# Patient Record
Sex: Female | Born: 1980 | Race: Black or African American | Hispanic: No | Marital: Married | Smoking: Never smoker
Health system: Southern US, Community
[De-identification: ages and names within clinical notes are randomized; demographics above are authoritative.]

## PROBLEM LIST (undated history)

## (undated) HISTORY — PX: TUBAL LIGATION: SHX77

## (undated) HISTORY — PX: CHOLECYSTECTOMY: SHX55

---

## 1999-04-15 ENCOUNTER — Encounter: Payer: Self-pay | Admitting: Obstetrics & Gynecology

## 1999-04-15 ENCOUNTER — Inpatient Hospital Stay (HOSPITAL_COMMUNITY): Admission: AD | Admit: 1999-04-15 | Discharge: 1999-04-15 | Payer: Self-pay | Admitting: Obstetrics & Gynecology

## 2001-05-18 ENCOUNTER — Other Ambulatory Visit: Admission: RE | Admit: 2001-05-18 | Discharge: 2001-05-18 | Payer: Self-pay | Admitting: Family Medicine

## 2003-04-01 ENCOUNTER — Ambulatory Visit (HOSPITAL_COMMUNITY): Admission: AD | Admit: 2003-04-01 | Discharge: 2003-04-02 | Payer: Self-pay | Admitting: Obstetrics and Gynecology

## 2003-04-03 ENCOUNTER — Ambulatory Visit (HOSPITAL_COMMUNITY): Admission: AD | Admit: 2003-04-03 | Discharge: 2003-04-03 | Payer: Self-pay | Admitting: Obstetrics and Gynecology

## 2003-04-24 ENCOUNTER — Inpatient Hospital Stay (HOSPITAL_COMMUNITY): Admission: RE | Admit: 2003-04-24 | Discharge: 2003-04-26 | Payer: Self-pay | Admitting: Obstetrics and Gynecology

## 2003-08-07 ENCOUNTER — Emergency Department (HOSPITAL_COMMUNITY): Admission: EM | Admit: 2003-08-07 | Discharge: 2003-08-07 | Payer: Self-pay | Admitting: Emergency Medicine

## 2004-12-20 ENCOUNTER — Emergency Department (HOSPITAL_COMMUNITY): Admission: EM | Admit: 2004-12-20 | Discharge: 2004-12-20 | Payer: Self-pay | Admitting: Family Medicine

## 2005-01-31 ENCOUNTER — Ambulatory Visit (HOSPITAL_BASED_OUTPATIENT_CLINIC_OR_DEPARTMENT_OTHER): Admission: RE | Admit: 2005-01-31 | Discharge: 2005-02-01 | Payer: Self-pay | Admitting: Specialist

## 2005-01-31 ENCOUNTER — Ambulatory Visit (HOSPITAL_COMMUNITY): Admission: RE | Admit: 2005-01-31 | Discharge: 2005-01-31 | Payer: Self-pay | Admitting: Specialist

## 2005-01-31 ENCOUNTER — Encounter (INDEPENDENT_AMBULATORY_CARE_PROVIDER_SITE_OTHER): Payer: Self-pay | Admitting: *Deleted

## 2005-11-28 ENCOUNTER — Ambulatory Visit (HOSPITAL_COMMUNITY): Admission: RE | Admit: 2005-11-28 | Discharge: 2005-11-28 | Payer: Self-pay | Admitting: Orthopedic Surgery

## 2006-06-20 ENCOUNTER — Emergency Department (HOSPITAL_COMMUNITY): Admission: EM | Admit: 2006-06-20 | Discharge: 2006-06-20 | Payer: Self-pay | Admitting: Family Medicine

## 2007-07-12 ENCOUNTER — Ambulatory Visit: Payer: Self-pay | Admitting: Internal Medicine

## 2007-07-13 ENCOUNTER — Ambulatory Visit: Payer: Self-pay | Admitting: *Deleted

## 2007-10-29 ENCOUNTER — Emergency Department (HOSPITAL_COMMUNITY): Admission: EM | Admit: 2007-10-29 | Discharge: 2007-10-29 | Payer: Self-pay | Admitting: Emergency Medicine

## 2007-11-01 ENCOUNTER — Ambulatory Visit: Payer: Self-pay | Admitting: Family Medicine

## 2007-11-03 ENCOUNTER — Ambulatory Visit: Payer: Self-pay | Admitting: Internal Medicine

## 2007-11-29 ENCOUNTER — Ambulatory Visit: Payer: Self-pay | Admitting: Internal Medicine

## 2007-12-02 ENCOUNTER — Emergency Department (HOSPITAL_COMMUNITY): Admission: EM | Admit: 2007-12-02 | Discharge: 2007-12-02 | Payer: Self-pay | Admitting: Family Medicine

## 2007-12-21 ENCOUNTER — Emergency Department (HOSPITAL_COMMUNITY): Admission: EM | Admit: 2007-12-21 | Discharge: 2007-12-21 | Payer: Self-pay | Admitting: Emergency Medicine

## 2008-01-23 ENCOUNTER — Emergency Department (HOSPITAL_COMMUNITY): Admission: EM | Admit: 2008-01-23 | Discharge: 2008-01-23 | Payer: Self-pay | Admitting: Family Medicine

## 2008-01-23 ENCOUNTER — Inpatient Hospital Stay (HOSPITAL_COMMUNITY): Admission: AD | Admit: 2008-01-23 | Discharge: 2008-01-23 | Payer: Self-pay | Admitting: Gynecology

## 2008-01-27 ENCOUNTER — Inpatient Hospital Stay (HOSPITAL_COMMUNITY): Admission: AD | Admit: 2008-01-27 | Discharge: 2008-01-27 | Payer: Self-pay | Admitting: Obstetrics & Gynecology

## 2008-01-31 ENCOUNTER — Inpatient Hospital Stay (HOSPITAL_COMMUNITY): Admission: AD | Admit: 2008-01-31 | Discharge: 2008-01-31 | Payer: Self-pay | Admitting: Gynecology

## 2008-02-09 ENCOUNTER — Inpatient Hospital Stay (HOSPITAL_COMMUNITY): Admission: RE | Admit: 2008-02-09 | Discharge: 2008-02-09 | Payer: Self-pay | Admitting: Gynecology

## 2008-02-11 ENCOUNTER — Encounter (INDEPENDENT_AMBULATORY_CARE_PROVIDER_SITE_OTHER): Payer: Self-pay | Admitting: Gynecology

## 2008-02-11 ENCOUNTER — Ambulatory Visit (HOSPITAL_COMMUNITY): Admission: RE | Admit: 2008-02-11 | Discharge: 2008-02-11 | Payer: Self-pay | Admitting: Gynecology

## 2008-02-11 ENCOUNTER — Ambulatory Visit: Payer: Self-pay | Admitting: Gynecology

## 2008-02-14 ENCOUNTER — Ambulatory Visit: Payer: Self-pay | Admitting: Internal Medicine

## 2008-03-02 ENCOUNTER — Ambulatory Visit: Payer: Self-pay | Admitting: Obstetrics & Gynecology

## 2008-03-06 ENCOUNTER — Ambulatory Visit: Payer: Self-pay | Admitting: Internal Medicine

## 2008-03-06 ENCOUNTER — Encounter: Payer: Self-pay | Admitting: Family Medicine

## 2008-03-06 LAB — CONVERTED CEMR LAB
AST: 13 units/L (ref 0–37)
Amphetamine Screen, Ur: NEGATIVE
Basophils Relative: 0 % (ref 0–1)
Benzodiazepines.: NEGATIVE
CO2: 18 meq/L — ABNORMAL LOW (ref 19–32)
Chloride: 105 meq/L (ref 96–112)
Creatinine,U: 196.6 mg/dL
Eosinophils Absolute: 0 10*3/uL (ref 0.0–0.7)
Glucose, Bld: 88 mg/dL (ref 70–99)
Hemoglobin: 12.6 g/dL (ref 12.0–15.0)
Lymphs Abs: 1.4 10*3/uL (ref 0.7–4.0)
MCHC: 33.2 g/dL (ref 30.0–36.0)
MCV: 89.4 fL (ref 78.0–100.0)
Methadone: NEGATIVE
Monocytes Absolute: 0.4 10*3/uL (ref 0.1–1.0)
Monocytes Relative: 9 % (ref 3–12)
Neutrophils Relative %: 57 % (ref 43–77)
Opiate Screen, Urine: NEGATIVE
Platelets: 240 10*3/uL (ref 150–400)
Potassium: 4.1 meq/L (ref 3.5–5.3)
Propoxyphene: NEGATIVE
RBC: 4.24 M/uL (ref 3.87–5.11)
TSH: 0.594 microintl units/mL (ref 0.350–4.50)
WBC: 4.1 10*3/uL (ref 4.0–10.5)

## 2008-03-13 ENCOUNTER — Ambulatory Visit: Payer: Self-pay | Admitting: Internal Medicine

## 2008-04-19 ENCOUNTER — Ambulatory Visit: Payer: Self-pay | Admitting: Internal Medicine

## 2008-06-21 ENCOUNTER — Emergency Department (HOSPITAL_COMMUNITY): Admission: EM | Admit: 2008-06-21 | Discharge: 2008-06-21 | Payer: Self-pay | Admitting: Emergency Medicine
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
## 2009-09-01 ENCOUNTER — Emergency Department (HOSPITAL_COMMUNITY): Admission: EM | Admit: 2009-09-01 | Discharge: 2009-09-01 | Payer: Self-pay | Admitting: Family Medicine

## 2009-09-04 ENCOUNTER — Emergency Department (HOSPITAL_COMMUNITY): Admission: EM | Admit: 2009-09-04 | Discharge: 2009-09-04 | Payer: Self-pay | Admitting: Family Medicine

## 2009-11-08 ENCOUNTER — Ambulatory Visit (HOSPITAL_COMMUNITY): Admission: RE | Admit: 2009-11-08 | Discharge: 2009-11-08 | Payer: Self-pay | Admitting: Obstetrics

## 2010-04-07 ENCOUNTER — Emergency Department (HOSPITAL_COMMUNITY)
Admission: EM | Admit: 2010-04-07 | Discharge: 2010-04-07 | Payer: Self-pay | Source: Home / Self Care | Admitting: Family Medicine

## 2010-04-12 ENCOUNTER — Inpatient Hospital Stay (HOSPITAL_COMMUNITY): Admission: AD | Admit: 2010-04-12 | Discharge: 2010-04-12 | Payer: Self-pay | Admitting: Obstetrics and Gynecology

## 2010-04-29 ENCOUNTER — Ambulatory Visit: Payer: Self-pay | Admitting: Obstetrics & Gynecology

## 2010-04-29 ENCOUNTER — Inpatient Hospital Stay (HOSPITAL_COMMUNITY): Admission: AD | Admit: 2010-04-29 | Discharge: 2010-04-29 | Payer: Self-pay | Admitting: Obstetrics and Gynecology

## 2010-09-20 ENCOUNTER — Other Ambulatory Visit: Payer: Medicaid Other

## 2010-09-20 DIAGNOSIS — O30009 Twin pregnancy, unspecified number of placenta and unspecified number of amniotic sacs, unspecified trimester: Secondary | ICD-10-CM

## 2010-09-27 ENCOUNTER — Other Ambulatory Visit: Payer: Medicaid Other

## 2010-09-27 DIAGNOSIS — O30009 Twin pregnancy, unspecified number of placenta and unspecified number of amniotic sacs, unspecified trimester: Secondary | ICD-10-CM

## 2010-10-03 LAB — CBC
HCT: 34.9 % — ABNORMAL LOW (ref 36.0–46.0)
Hemoglobin: 11.7 g/dL — ABNORMAL LOW (ref 12.0–15.0)
MCH: 30.5 pg (ref 26.0–34.0)
MCHC: 33.6 g/dL (ref 30.0–36.0)
MCV: 90.7 fL (ref 78.0–100.0)
Platelets: 250 10*3/uL (ref 150–400)
RBC: 3.85 MIL/uL — ABNORMAL LOW (ref 3.87–5.11)

## 2010-10-03 LAB — URINALYSIS, ROUTINE W REFLEX MICROSCOPIC
Glucose, UA: NEGATIVE mg/dL
Hgb urine dipstick: NEGATIVE
Nitrite: NEGATIVE
Protein, ur: NEGATIVE mg/dL
pH: 7 (ref 5.0–8.0)

## 2010-10-03 LAB — WET PREP, GENITAL: Yeast Wet Prep HPF POC: NONE SEEN

## 2010-10-04 ENCOUNTER — Other Ambulatory Visit: Payer: Medicaid Other

## 2010-10-04 DIAGNOSIS — O30009 Twin pregnancy, unspecified number of placenta and unspecified number of amniotic sacs, unspecified trimester: Secondary | ICD-10-CM

## 2010-10-09 LAB — CULTURE, ROUTINE-ABSCESS

## 2010-10-11 ENCOUNTER — Other Ambulatory Visit: Payer: Medicaid Other

## 2010-10-11 DIAGNOSIS — O30009 Twin pregnancy, unspecified number of placenta and unspecified number of amniotic sacs, unspecified trimester: Secondary | ICD-10-CM

## 2010-10-13 ENCOUNTER — Inpatient Hospital Stay (HOSPITAL_COMMUNITY)
Admission: AD | Admit: 2010-10-13 | Discharge: 2010-10-14 | Disposition: A | Discharge status: Home / Self Care | Payer: Medicaid Other | Source: Ambulatory Visit | Attending: Obstetrics and Gynecology | Admitting: Obstetrics and Gynecology

## 2010-10-13 DIAGNOSIS — O4700 False labor before 37 completed weeks of gestation, unspecified trimester: Secondary | ICD-10-CM

## 2010-10-13 LAB — URINALYSIS, ROUTINE W REFLEX MICROSCOPIC
Bilirubin Urine: NEGATIVE
Protein, ur: NEGATIVE mg/dL
Urobilinogen, UA: 0.2 mg/dL (ref 0.0–1.0)

## 2010-10-18 ENCOUNTER — Other Ambulatory Visit: Payer: Medicaid Other

## 2010-10-18 DIAGNOSIS — O30009 Twin pregnancy, unspecified number of placenta and unspecified number of amniotic sacs, unspecified trimester: Secondary | ICD-10-CM

## 2010-10-23 ENCOUNTER — Other Ambulatory Visit: Payer: Medicaid Other

## 2010-10-23 DIAGNOSIS — O30009 Twin pregnancy, unspecified number of placenta and unspecified number of amniotic sacs, unspecified trimester: Secondary | ICD-10-CM

## 2010-10-26 ENCOUNTER — Inpatient Hospital Stay (HOSPITAL_COMMUNITY)
Admission: AD | Admit: 2010-10-26 | Discharge: 2010-10-26 | Disposition: A | Discharge status: Home / Self Care | Payer: Medicaid Other | Source: Ambulatory Visit | Attending: Obstetrics and Gynecology | Admitting: Obstetrics and Gynecology

## 2010-10-26 DIAGNOSIS — R197 Diarrhea, unspecified: Secondary | ICD-10-CM | POA: Insufficient documentation

## 2010-10-26 DIAGNOSIS — O212 Late vomiting of pregnancy: Secondary | ICD-10-CM | POA: Insufficient documentation

## 2010-10-26 DIAGNOSIS — O139 Gestational [pregnancy-induced] hypertension without significant proteinuria, unspecified trimester: Secondary | ICD-10-CM | POA: Insufficient documentation

## 2010-10-26 LAB — URINALYSIS, ROUTINE W REFLEX MICROSCOPIC
Bilirubin Urine: NEGATIVE
Glucose, UA: NEGATIVE mg/dL
Ketones, ur: NEGATIVE mg/dL
Protein, ur: NEGATIVE mg/dL
Specific Gravity, Urine: 1.01 (ref 1.005–1.030)

## 2010-10-26 LAB — CBC
Hemoglobin: 10.7 g/dL — ABNORMAL LOW (ref 12.0–15.0)
MCHC: 32 g/dL (ref 30.0–36.0)
MCV: 86.5 fL (ref 78.0–100.0)
Platelets: 137 10*3/uL — ABNORMAL LOW (ref 150–400)
WBC: 4.3 10*3/uL (ref 4.0–10.5)

## 2010-10-26 LAB — URIC ACID: Uric Acid, Serum: 4.4 mg/dL (ref 2.4–7.0)

## 2010-10-27 ENCOUNTER — Inpatient Hospital Stay (HOSPITAL_COMMUNITY)
Admission: RE | Admit: 2010-10-27 | Discharge: 2010-10-30 | DRG: 765 | Disposition: A | Discharge status: Home / Self Care | Payer: Medicaid Other | Source: Ambulatory Visit | Attending: Obstetrics and Gynecology | Admitting: Obstetrics and Gynecology

## 2010-10-27 ENCOUNTER — Other Ambulatory Visit: Payer: Self-pay | Admitting: Obstetrics and Gynecology

## 2010-10-27 DIAGNOSIS — Z302 Encounter for sterilization: Secondary | ICD-10-CM

## 2010-10-27 DIAGNOSIS — O34219 Maternal care for unspecified type scar from previous cesarean delivery: Secondary | ICD-10-CM | POA: Diagnosis present

## 2010-10-27 DIAGNOSIS — O30009 Twin pregnancy, unspecified number of placenta and unspecified number of amniotic sacs, unspecified trimester: Principal | ICD-10-CM | POA: Diagnosis present

## 2010-10-27 LAB — CBC
HCT: 33.9 % — ABNORMAL LOW (ref 36.0–46.0)
Hemoglobin: 11 g/dL — ABNORMAL LOW (ref 12.0–15.0)
MCHC: 32.4 g/dL (ref 30.0–36.0)
MCV: 86.7 fL (ref 78.0–100.0)

## 2010-10-27 LAB — RPR: RPR Ser Ql: NONREACTIVE

## 2010-10-28 LAB — CBC
HCT: 31 % — ABNORMAL LOW (ref 36.0–46.0)
Hemoglobin: 10.1 g/dL — ABNORMAL LOW (ref 12.0–15.0)
MCHC: 32.6 g/dL (ref 30.0–36.0)
MCV: 86.1 fL (ref 78.0–100.0)
WBC: 8.7 10*3/uL (ref 4.0–10.5)

## 2010-10-30 ENCOUNTER — Other Ambulatory Visit: Payer: Medicaid Other

## 2010-10-30 NOTE — Op Note (Signed)
NAMEJIA, DOTTAVIO              ACCOUNT NO.:  [id number]  MEDICAL RECORD NO.:  [id number]           PATIENT TYPE:  I  LOCATION:  9131                          [facility]  PHYSICIAN:  Marketta Valadez L. Kristan Votta, M.D.DATE OF BIRTH:  July 06, 1981  DATE OF PROCEDURE:  10/27/2010 DATE OF DISCHARGE:                              OPERATIVE REPORT   PREOPERATIVE DIAGNOSES:  Twins at 36 weeks, previous cesarean section, spontaneous rupture of membranes, labor and desires permanent sterilization.  POSTOPERATIVE DIAGNOSES:  Twins at 36 weeks, previous cesarean section, spontaneous rupture of membranes, labor and desires permanent sterilization.  PROCEDURES:  Repeat low transverse cesarean section and bilateral tubal ligation with Filshie clips  SURGEON:  Grover Woodfield L. Zalyn Amend, MD  ANESTHESIA:  Spinal.  FINDINGS:  Baby A and baby B both female infants, both in cephalic presentation, both with Apgars 9 at 1 minute and 9 at 5 minutes.  SPECIMENS:  Placenta sent to Pathology.  ESTIMATED BLOOD LOSS:  500 mL.  COMPLICATIONS:  None.  PROCEDURE IN DETAILS:  The patient was taken to the operating room. Spinal was placed.  She was prepped and draped.  A Foley catheter was inserted.  A low transverse incision was made, carried down to the fascia.  Fascia scored in the midline and extended laterally.  Rectus muscles were separated in the midline and the peritoneum was entered bluntly.  Peritoneal incision was then stretched.  The bladder blade was inserted.  The bladder flap was created sharply and then digitally.  The bladder blade was then readjusted.  A low transverse incision was made in the uterus.  Baby A was in cephalic presentation.  Amniotic fluid was clear.  There was a loose nuchal cord x1.  Baby A was delivered easily with no difficulty.  Cord was clamped and cut and baby was handed to the awaiting neonatal team.  We then broke the sac of baby B and baby B came down cephalic and was  delivered easily with a female infant as well, Apgars 9 at 1 minute and 9 at 5 minutes.  After the cord was clamped and cut, the placentas were manually removed, noted to be normal intact with a three-vessel cord.  Prior to that cord blood and cord pH had been obtained from each umbilical cord.  The uterus was exteriorized.  It was cleared of all clots and debris.  The uterine incision was closed in one layer using O chromic in a running locked stitch.  Irrigation was performed.  Filshie clips were placed across the midportion of each fallopian tube segment for permanent sterilization.  The uterus was returned to the abdomen.  Irrigation was performed.  Hemostasis was again noted.  The peritoneum was closed using 0 Vicryl.  The fascia was closed using 0 Vicryl in a running stitch.  After irrigation of subcutaneous layer, the skin was closed with staples.  All sponge, lap and instrument counts were correct x2.  The patient went to recovery room in stable condition.     Ilham Roughton L. Vincente Poli, M.D.     Florestine Avers  D:  10/27/2010  T:  10/27/2010  Job:  [id number]  Electronically Signed by Marcelle Overlie M.D. on 10/30/2010 09:16:39 AM

## 2010-11-01 ENCOUNTER — Encounter (HOSPITAL_COMMUNITY)
Admission: RE | Admit: 2010-11-01 | Discharge: 2010-11-01 | Disposition: A | Discharge status: Home / Self Care | Payer: Medicaid Other | Source: Ambulatory Visit | Attending: Obstetrics and Gynecology | Admitting: Obstetrics and Gynecology

## 2010-11-01 DIAGNOSIS — O923 Agalactia: Secondary | ICD-10-CM | POA: Insufficient documentation

## 2010-11-07 NOTE — Discharge Summary (Signed)
Claire Turner, Claire Turner              ACCOUNT NO.:  [id number]  MEDICAL RECORD NO.:  [id number]           PATIENT TYPE:  I  LOCATION:  9131                          [facility]  PHYSICIAN:  Johnatan Baskette L. Sumayah Bearse, M.D.DATE OF BIRTH:  [birth year]  DATE OF ADMISSION:  10/27/2010 DATE OF DISCHARGE:  10/30/2010                              DISCHARGE SUMMARY   ADMITTING DIAGNOSES: 1. Intrauterine pregnancy at 36+ weeks' estimated gestational age. 2. Twin gestation. 3. Spontaneous rupture of membranes.  DISCHARGE DIAGNOSES: 1. Status post low transverse cesarean section. 2. Viable female infant. 3. Permanent sterilization.  PROCEDURES: 1. Repeat low transverse cesarean section. 2. Bilateral tubal ligation with Filshie clips.  REASON FOR ADMISSION:  Please see written H and P.  HOSPITAL COURSE:  The patient is a 30 year old gravida 5, para 2-0-2-2, who was admitted to [facility] at 36+ weeks' estimated gestational age with spontaneous rupture of membranes.  The patient's pregnancy had been complicated by twin gestation.  The patient had had history of a shoulder dystocia and previous cesarean delivery.  She had requested a VBAC with this pregnancy; however, now at this time, she had requested cesarean delivery.  On admission, vital signs were stable. Contractions approximately every 2-3 minutes.  Fetal heart tones are reassuring both baby A and B.  The patient was then transferred to the operating room where spinal anesthesia was administered without difficulty.  A low transverse incision was made to delivery of viable female infant.  Both with Apgars of 9 at 1 minute and 9 at 5 minutes. Bilateral tubal ligation was performed without difficulty.  The patient tolerated the procedure well and taken to the recovery room in stable condition.  On postoperative day #1, the patient was without complaint. Babies were both in the [employer].  Vital signs were stable. Heart  rate was 76-77.  Abdomen is soft.  Fundus is firm and nontender. Abdominal dressing had to be clean, dry, and intact.  Foley had been discontinued and she was voiding well.  Laboratory findings showed hemoglobin 10.1 and platelet count 133,000.  Blood type is known to be O+.  On postoperative day #2, the patient was without complaint.  Vital signs remained stable.  Abdomen is soft.  Fundus is firm and nontender. Incision was clean, dry, and intact.  She was ambulating well.  On postoperative day #3, the patient was complaining of some burning with urination.  Vital signs are stable.  Abdomen is soft.  Fundus is firm and nontender.  Incision was clean, dry, and intact.  Staples were removed and the patient was later discharged home.  CONDITION ON DISCHARGE:  Stable.  DIET:  Regular as tolerated.  ACTIVITY:  No heavy lifting.  No driving x2 weeks.  No vaginal entry.  FOLLOWUP:  The patient is to follow up in the office in 1-2 weeks for an incision check.  She is to call for temperature greater than 100 degrees, persistent nausea, vomiting, heavy vaginal bleeding, and/or redness or drainage from the incisional site.  DISCHARGE MEDICATIONS: 1. Tylox #30 one p.o. every 4-6 hours p.r.n. 2. Motrin 600 mg every 6  hours. 3. Keflex 500 mg one p.o. q.i.d. x7 days. 4. Prenatal vitamins one p.o. daily.     Julio Sicks, N.P.   ______________________________ Stann Mainland. Vincente Poli, M.D.    CC/MEDQ  D:  10/30/2010  T:  10/30/2010  Job:  [id number]  Electronically Signed by Julio Sicks N.P. on 11/04/2010 09:37:35 AM Electronically Signed by Marcelle Overlie M.D. on 11/07/2010 06:18:13 PM

## 2010-11-08 ENCOUNTER — Other Ambulatory Visit (HOSPITAL_COMMUNITY): Payer: Medicaid Other

## 2010-12-03 NOTE — Op Note (Signed)
Claire Turner, Claire Turner              ACCOUNT NO.:  [id number]   MEDICAL RECORD NO.:  [id number]          PATIENT TYPE:  AMB   LOCATION:  SDC                           [facility]   PHYSICIAN:  Ginger Carne, MD  DATE OF BIRTH:  05-27-1981   DATE OF PROCEDURE:  02/11/2008  DATE OF DISCHARGE:                               OPERATIVE REPORT   PREOPERATIVE DIAGNOSIS:  First trimester missed abortion.   POSTOPERATIVE DIAGNOSIS:  First trimester missed abortion.   PROCEDURE:  Aspiration, dilation, and curettage.   SURGEON:  Ginger Carne, MD   ASSISTANT:  None.   COMPLICATIONS:  None immediate.   ESTIMATED BLOOD LOSS:  Minimal.   SPECIMEN:  Products of conception to pathology.   OPERATIVE FINDINGS:  External genitalia, vulva, and vagina normal.  Cervix smooth without erosions or lesions.  The uterus was 7 weeks in  size and 7 cm in dimension.  Products of conception noted in the  intrauterine cavity.   OPERATIVE PROCEDURE:  The patient was prepped and draped in the usual  fashion and placed in lithotomy position.  Betadine solution used for  antiseptic.  A tenaculum was placed on the anterior lip of the cervix,  dilation to accommodate a #7 suction cannula, and was followed by sharp  and suction curettage.  Specimen to pathology.  No active bleeding noted  after.  The patient tolerated the procedure well and returned to  postanesthesia recovery room in excellent condition.      Ginger Carne, MD  Electronically Signed     SHB/MEDQ  D:  02/11/2008  T:  02/12/2008  Job:  [id number]

## 2010-12-06 NOTE — Op Note (Signed)
NAME:  Claire Turner, Claire Turner                        ACCOUNT NO.:  [id number]   MEDICAL RECORD NO.:  [id number]                   PATIENT TYPE:  INP   LOCATION:  A427                                 FACILITY:  [facility]   PHYSICIAN:  Tilda Burrow, M.D.              DATE OF BIRTH:  1981-04-10   DATE OF PROCEDURE:  DATE OF DISCHARGE:                                 OPERATIVE REPORT   PREOPERATIVE DIAGNOSES:  1. Pregnancy 37-1/[redacted] weeks gestation.  2. Elective primary cesarean section secondary to history of LGA infant with     Erb's palsy and fourth degree laceration of prior pregnancy.  3. Large for gestational age (LGA) infant.   POSTOPERATIVE DIAGNOSES:  1. Pregnancy 37-1/[redacted] weeks gestation.  2. Elective primary cesarean section secondary to history of LGA infant with     Erb's palsy and fourth degree laceration of prior pregnancy.  3. Large for gestational age (LGA) infant.   PROCEDURE:  Primary low transverse cervical cesarean section.   SURGEON:  Tilda Burrow, M.D.   ASSISTANT:  Amie Critchley, CST and [place], [place] FA   ANESTHESIA:  Spinal.   COMPLICATIONS:  None.   FINDINGS:  An 8 pound 15 ounce large-framed, female infant at pelvic inlet.  Prognosis for vaginal delivery considered minimal.   DETAILS OF PROCEDURE:  The patient was taken to the operating room and  prepped and draped after spinal anesthesia introduced.  Then a Pfannenstiel-  type incision performed in the standard fashion.  A bladder flap was  developed on the anterior lower uterine segment with a large varicosity  right in the middle of the incision line.   A transverse uterine incision was made without difficulty and then the  vertex guided into the incision.  Due to the large size vacuum extractor was  necessary to guide the infant through the incision and then the shoulders  were released individually as we delivered the baby.  The cord was clamped  and the infant placed in the care of Dr. Milford Cage and Dr.  Milinda Cave, both of whom  attended the delivery.  See their notes for details.   Apgars of 9 and 9 were assigned.  Weight was subsequently determined to be 8  pounds 15 ounces.  The placenta delivered easily, Tomasa Blase presentation.  Membranes intact after cord blood samples obtained.  Uterine irrigation with  antibiotic containing solution was followed by a single layer of running  locking closure of the uterus, then 2 additional figure-of-eight sutures  were required at the left edge of the uterine incision to achieve  hemostasis.  A 2-0 chromic bladder flap closure was then performed; and then  the abdomen irrigated briefly.  The anterior peritoneum closed with a 2-0  chromic; and the fascia closed with #0 Vicryl.  The subcu tissue was  reapproximated to Scarpa's fascia with interrupted 2-0 plain x3 sutures; and  then staple closure of the skin completed  the procedure.  The patient  tolerated the procedure well and went to recovery room in good condition.      ___________________________________________                                            Tilda Burrow, M.D.   JVF/MEDQ  D:  04/24/2003  T:  04/24/2003  Job:  [id number]   cc:   Jeoffrey Massed, M.D.  [facility]  [place], [place] [address]  Fax: [phone]
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
## 2010-12-06 NOTE — Op Note (Signed)
NAMEMIEKO, Claire Turner              ACCOUNT NO.:  [id number]   MEDICAL RECORD NO.:  [id number]          PATIENT TYPE:  AMB   LOCATION:  SDS                          FACILITY:  [facility]   PHYSICIAN:  Nadara Mustard, MD     DATE OF BIRTH:  06/08/1981   DATE OF PROCEDURE:  11/28/2005  DATE OF DISCHARGE:                                 OPERATIVE REPORT   PREOP DIAGNOSIS:  Acute dislocation posteriorly, right radial head.   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   PROCEDURE:  Attempted closed reduction right radial head.   SURGEON.:  Nadara Mustard, MD   ANESTHESIA:  General.   ESTIMATED BLOOD LOSS:  None.   ANTIBIOTICS:  None.   DRAINS:  None.   COMPLICATIONS:  None.   DISPOSITION:  To PACU in stable condition.   INDICATIONS FOR PROCEDURE:  The patient is a 30 year old woman who states  she fell the shower with her elbow flexed at 90 degrees, and her hand  supinated with her hand behind her back.  She states she fell in the shower  and had acute pain in her right elbow.  She was seen at [facility].  Radiographs were obtained, which showed a dislocated right radial  head.  She was then seen in my office.  She underwent local anesthetic with  10 cc of an intra-articular injection with 1% lidocaine.  The patient was  extremely tense and had clenched fists in both upper extremities.  I was  unable to reduce the elbow with a closed technique with local sedation, and  the patient was brought to the OR at this time for attempt at a closed  reduction under general sedation.  Risks and benefits were discussed,  including pain, persistent dislocation, need for an open surgery.   DESCRIPTION OF PROCEDURE:  The patient was brought to OR room 4 and  underwent a general anesthetic.  After adequate level anesthesia obtained,  under C-arm fluoroscopy attempt at reduction maneuvers were performed to  reduce the radial head.  The radial  head would not reduce.  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  Further attempts of reduction were discontinued, due to the fact that  this may be the patient's normal variant and I did not want to proceed with  an open procedure if this was a chronic  subluxation.  The patient was extubated and taken to PACU in stable  condition.  We will get a CT scan of her right elbow to further evaluate the  radial head anatomy.  If, in fact, this does confirm that this was an acute  dislocation, we will proceed with open reduction of the radial head.      Nadara Mustard, MD  Electronically Signed     MVD/MEDQ  D:  11/28/2005  T:  12/01/2005  Job:  [id number]

## 2010-12-06 NOTE — Op Note (Signed)
Claire Turner, Claire Turner              ACCOUNT NO.:  [id number]   MEDICAL RECORD NO.:  [id number]          PATIENT TYPE:  AMB   LOCATION:  DSC                          FACILITY:  [facility]   PHYSICIAN:  Earvin Hansen L. [place]es[place], M.D.DATE OF BIRTH:  01/13/1981   DATE OF PROCEDURE:  01/31/2005  DATE OF DISCHARGE:                                 OPERATIVE REPORT   This is a 30 year old lady with severe macromastia, back and shoulder pain  secondary to large, pendulous breasts, intertriginous changes.   PROCEDURES PLANNED:  Bilateral breast reduction using the inferior pedicle  technique.   ANESTHESIA:  General.   Patient underwent general anesthesia and after she was drawn for the  inferior pedicle technique reduction mammoplasty remarking the nipple  areolar complexes back up to 20 cm from the suprasternal notch.  After  general anesthesia was administered and intubation was done prep was done to  the chest wall and abdomen using Betadine soap and solution, walled off with  sterile towels and draped so as to make a sterile field.  One quarter  percent Xylocaine with epinephrine 1:400,000 concentration was injected  locally 150 mL per side.  The wounds were scored with #15 blades and the  skin over the inferior pedicles deepithelized with #20 blades.  Medial and  lateral fatty dermal pedicles were sliced down to underlying fascia after  this.  New keyhole area was debulked and accessory breast tissue was  removed.  After this the flaps were transposed and stayed with 3-0 Prolene.  Subcutaneous closure was done with 3-0 Monocryl x2 layers and then a running  subcuticular stitch of 3-0 Monocryl and 5-0 Monocryl throughout the inverted  T.  The wounds were drained with a #10 Blake drain for the fluid which was  placed in the depths of the wound and brought out through the lateralmost  portion of the incision, secured with 3-0 Prolene.  Same procedure was  carried out on both sides.  The wounds were  clean.  Steri-Strips and soft  dressings were applied to all areas including 4 x 4s, ABDs, and Hypafix  tape.  She withstood the procedures very well and was taken to recovery in  excellent condition.       GLT/MEDQ  D:  01/31/2005  T:  01/31/2005  Job:  [id number]

## 2010-12-06 NOTE — Discharge Summary (Signed)
   NAME:  Claire Turner, Claire Turner                        ACCOUNT NO.:  [id number]   MEDICAL RECORD NO.:  [id number]                   PATIENT TYPE:  INP   LOCATION:  A427                                 FACILITY:  [facility]   PHYSICIAN:  Tilda Burrow, M.D.              DATE OF BIRTH:  01/10/1981   DATE OF ADMISSION:  04/24/2003  DATE OF DISCHARGE:  04/26/2003                                 DISCHARGE SUMMARY   ADMISSION DIAGNOSES:  1. Pregnancy at 38-1/[redacted] weeks gestation for primary cesarean section.  2. History of traumatic delivery of first infant with Erb palsy and fourth-     degree laceration.   DISCHARGE DIAGNOSES:  1. Pregnancy at 38-1/2 weeks, delivered.  2. History of traumatic first delivery.  3. Large for gestational age infant.   PROCEDURE:  Primary low transverse cervical cesarean section.   DISCHARGE MEDICATIONS:  1. Tylox one to two q.4h. p.r.n. pain.  2. Motrin 200 mg two q.4h. routinely for mild discomfort.   FOLLOW UP:  Five days for staple removal.   HISTORY OF PRESENT ILLNESS:  This 30 year old female, gravida 3, para 1, AB  2 with a history of traumatic first delivery at 37 weeks with an infant that  weighed 7 pounds 9 ounces is admitted for cesarean section after presenting  at the end of her pregnancy with a larger infant, with cervix, long, high, 1  cm dilated, at a -3 station, with estimated fetal weight of at least 7.5  pounds or more.   PAST MEDICAL HISTORY:  Positive for migraines.   PAST SURGICAL HISTORY:  1. Tonsillectomy  2. Cryocautery of the cervix.   HOSPITAL COURSE:  The patient was admitted through day surgery and underwent  a primary low transverse cervical cesarean section in uncomplicated fashion  with 600 cc ABL, delivering a healthy female.  Apgars were 9 and  9, with weight of the infant just under 9 pounds.  Postpartum, the patient  did excellently.  Discharge hemoglobin 9.5, hematocrit 29.4 compared to  admitting hemoglobin of 11.1 and  hematocrit 33.2.  Maternal blood type is O  positive.  Follow up is in five days for staple removal.    ___________________________________________                                         Tilda Burrow, M.D.   JVF/MEDQ  D:  04/26/2003  T:  04/26/2003  Job:  [id number]

## 2010-12-28 ENCOUNTER — Emergency Department (HOSPITAL_COMMUNITY)
Admission: EM | Admit: 2010-12-28 | Discharge: 2010-12-29 | Disposition: A | Discharge status: Home / Self Care | Payer: Medicaid Other | Attending: Emergency Medicine | Admitting: Emergency Medicine

## 2010-12-28 DIAGNOSIS — R112 Nausea with vomiting, unspecified: Secondary | ICD-10-CM | POA: Insufficient documentation

## 2010-12-28 DIAGNOSIS — R1310 Dysphagia, unspecified: Secondary | ICD-10-CM | POA: Insufficient documentation

## 2010-12-28 DIAGNOSIS — K219 Gastro-esophageal reflux disease without esophagitis: Secondary | ICD-10-CM | POA: Insufficient documentation

## 2010-12-28 DIAGNOSIS — Z79899 Other long term (current) drug therapy: Secondary | ICD-10-CM | POA: Insufficient documentation

## 2010-12-28 DIAGNOSIS — R630 Anorexia: Secondary | ICD-10-CM | POA: Insufficient documentation

## 2010-12-28 DIAGNOSIS — R1013 Epigastric pain: Secondary | ICD-10-CM | POA: Insufficient documentation

## 2010-12-28 LAB — URINALYSIS, ROUTINE W REFLEX MICROSCOPIC
Glucose, UA: NEGATIVE mg/dL
Leukocytes, UA: NEGATIVE
pH: 7.5 (ref 5.0–8.0)

## 2010-12-29 ENCOUNTER — Inpatient Hospital Stay (INDEPENDENT_AMBULATORY_CARE_PROVIDER_SITE_OTHER)
Admission: RE | Admit: 2010-12-29 | Discharge: 2010-12-29 | Disposition: A | Discharge status: Home / Self Care | Payer: Medicaid Other | Source: Ambulatory Visit | Attending: Emergency Medicine | Admitting: Emergency Medicine

## 2010-12-29 DIAGNOSIS — K2990 Gastroduodenitis, unspecified, without bleeding: Secondary | ICD-10-CM

## 2011-04-17 LAB — POCT URINALYSIS DIP (DEVICE)
Glucose, UA: NEGATIVE
Nitrite: NEGATIVE
Operator id: 235561
Urobilinogen, UA: 0.2

## 2011-04-17 LAB — RPR: RPR Ser Ql: NONREACTIVE

## 2011-04-17 LAB — CBC
RBC: 4.09
WBC: 5

## 2011-04-17 LAB — HCG, QUANTITATIVE, PREGNANCY: hCG, Beta Chain, Quant, S: 7417 — ABNORMAL HIGH

## 2011-04-17 LAB — GC/CHLAMYDIA PROBE AMP, GENITAL: Chlamydia, DNA Probe: NEGATIVE

## 2011-04-17 LAB — WET PREP, GENITAL
Trich, Wet Prep: NONE SEEN
Yeast Wet Prep HPF POC: NONE SEEN

## 2011-04-17 LAB — POCT PREGNANCY, URINE: Preg Test, Ur: POSITIVE

## 2011-04-18 LAB — ABO/RH: ABO/RH(D): O POS

## 2011-04-18 LAB — CBC
HCT: 35.6 — ABNORMAL LOW
MCV: 90.1
RBC: 3.95
WBC: 4.3

## 2011-05-12 ENCOUNTER — Inpatient Hospital Stay (INDEPENDENT_AMBULATORY_CARE_PROVIDER_SITE_OTHER)
Admission: RE | Admit: 2011-05-12 | Discharge: 2011-05-12 | Disposition: A | Discharge status: Home / Self Care | Payer: Medicaid Other | Source: Ambulatory Visit | Attending: Emergency Medicine | Admitting: Emergency Medicine

## 2011-05-12 DIAGNOSIS — R42 Dizziness and giddiness: Secondary | ICD-10-CM

## 2013-12-07 ENCOUNTER — Emergency Department (HOSPITAL_COMMUNITY)
Admission: EM | Admit: 2013-12-07 | Discharge: 2013-12-07 | Disposition: A | Discharge status: Home / Self Care | Payer: BC Managed Care – PPO | Attending: Emergency Medicine | Admitting: Emergency Medicine

## 2013-12-07 ENCOUNTER — Emergency Department (HOSPITAL_COMMUNITY): Payer: BC Managed Care – PPO

## 2013-12-07 ENCOUNTER — Encounter (HOSPITAL_COMMUNITY): Payer: Self-pay | Admitting: Emergency Medicine

## 2013-12-07 DIAGNOSIS — A499 Bacterial infection, unspecified: Secondary | ICD-10-CM | POA: Insufficient documentation

## 2013-12-07 DIAGNOSIS — Z3202 Encounter for pregnancy test, result negative: Secondary | ICD-10-CM | POA: Insufficient documentation

## 2013-12-07 DIAGNOSIS — N760 Acute vaginitis: Secondary | ICD-10-CM | POA: Insufficient documentation

## 2013-12-07 DIAGNOSIS — B9689 Other specified bacterial agents as the cause of diseases classified elsewhere: Secondary | ICD-10-CM | POA: Insufficient documentation

## 2013-12-07 DIAGNOSIS — N83209 Unspecified ovarian cyst, unspecified side: Secondary | ICD-10-CM

## 2013-12-07 DIAGNOSIS — R109 Unspecified abdominal pain: Secondary | ICD-10-CM

## 2013-12-07 DIAGNOSIS — Z9089 Acquired absence of other organs: Secondary | ICD-10-CM | POA: Insufficient documentation

## 2013-12-07 LAB — URINALYSIS, ROUTINE W REFLEX MICROSCOPIC
Bilirubin Urine: NEGATIVE
Glucose, UA: NEGATIVE mg/dL
HGB URINE DIPSTICK: NEGATIVE
KETONES UR: NEGATIVE mg/dL
Leukocytes, UA: NEGATIVE
Nitrite: NEGATIVE
PROTEIN: NEGATIVE mg/dL
Specific Gravity, Urine: 1.022 (ref 1.005–1.030)
Urobilinogen, UA: 0.2 mg/dL (ref 0.0–1.0)
pH: 5.5 (ref 5.0–8.0)

## 2013-12-07 LAB — WET PREP, GENITAL
Trich, Wet Prep: NONE SEEN
Yeast Wet Prep HPF POC: NONE SEEN

## 2013-12-07 LAB — CBC WITH DIFFERENTIAL/PLATELET
BASOS ABS: 0 10*3/uL (ref 0.0–0.1)
BASOS PCT: 0 % (ref 0–1)
EOS PCT: 0 % (ref 0–5)
Eosinophils Absolute: 0 10*3/uL (ref 0.0–0.7)
HEMATOCRIT: 40.2 % (ref 36.0–46.0)
Hemoglobin: 13.3 g/dL (ref 12.0–15.0)
Lymphocytes Relative: 35 % (ref 12–46)
Lymphs Abs: 2.1 10*3/uL (ref 0.7–4.0)
MCH: 29.6 pg (ref 26.0–34.0)
MCHC: 33.1 g/dL (ref 30.0–36.0)
MCV: 89.3 fL (ref 78.0–100.0)
MONO ABS: 0.6 10*3/uL (ref 0.1–1.0)
Monocytes Relative: 10 % (ref 3–12)
Neutro Abs: 3.3 10*3/uL (ref 1.7–7.7)
Neutrophils Relative %: 55 % (ref 43–77)
PLATELETS: 251 10*3/uL (ref 150–400)
RBC: 4.5 MIL/uL (ref 3.87–5.11)
RDW: 13.4 % (ref 11.5–15.5)
WBC: 6 10*3/uL (ref 4.0–10.5)

## 2013-12-07 LAB — COMPREHENSIVE METABOLIC PANEL
ALBUMIN: 4.2 g/dL (ref 3.5–5.2)
ALT: 17 U/L (ref 0–35)
AST: 18 U/L (ref 0–37)
Alkaline Phosphatase: 52 U/L (ref 39–117)
BUN: 14 mg/dL (ref 6–23)
CALCIUM: 9.8 mg/dL (ref 8.4–10.5)
CO2: 24 mEq/L (ref 19–32)
CREATININE: 0.77 mg/dL (ref 0.50–1.10)
Chloride: 101 mEq/L (ref 96–112)
GFR calc Af Amer: >90 mL/min (ref >90)
GFR calc non Af Amer: >90 mL/min (ref >90)
Glucose, Bld: 89 mg/dL (ref 70–99)
Potassium: 3.9 mEq/L (ref 3.7–5.3)
SODIUM: 138 meq/L (ref 137–147)
TOTAL PROTEIN: 8.1 g/dL (ref 6.0–8.3)
Total Bilirubin: 0.4 mg/dL (ref 0.3–1.2)

## 2013-12-07 LAB — PREGNANCY, URINE: Preg Test, Ur: NEGATIVE

## 2013-12-07 LAB — LIPASE, BLOOD: Lipase: 32 U/L (ref 11–59)

## 2013-12-07 MED ORDER — MORPHINE SULFATE 4 MG/ML IJ SOLN
4.0000 mg | INTRAMUSCULAR | Status: AC | PRN
  Administered 2013-12-07 (×2): 4 mg via INTRAVENOUS
  Filled 2013-12-07 (×2): qty 1

## 2013-12-07 MED ORDER — MORPHINE SULFATE 4 MG/ML IJ SOLN
4.0000 mg | INTRAMUSCULAR | Status: DC | PRN
  Administered 2013-12-07: 4 mg via INTRAVENOUS
  Filled 2013-12-07: qty 1

## 2013-12-07 MED ORDER — HYDROCODONE-ACETAMINOPHEN 5-325 MG PO TABS: 1.0000 | ORAL_TABLET | ORAL | Status: DC | PRN

## 2013-12-07 MED ORDER — IOHEXOL 300 MG/ML  SOLN
25.0000 mL | Freq: Once | INTRAMUSCULAR | Status: AC | PRN
  Administered 2013-12-07: 25 mL via ORAL

## 2013-12-07 MED ORDER — METRONIDAZOLE 500 MG PO TABS: 500.0000 mg | ORAL_TABLET | Freq: Two times a day (BID) | ORAL | Status: DC

## 2013-12-07 MED ORDER — IOHEXOL 300 MG/ML  SOLN
100.0000 mL | Freq: Once | INTRAMUSCULAR | Status: AC | PRN
  Administered 2013-12-07: 100 mL via INTRAVENOUS

## 2013-12-07 MED ORDER — ONDANSETRON HCL 4 MG/2ML IJ SOLN
4.0000 mg | INTRAMUSCULAR | Status: AC | PRN
  Administered 2013-12-07 (×2): 4 mg via INTRAVENOUS
  Filled 2013-12-07 (×2): qty 2

## 2013-12-07 MED ORDER — ONDANSETRON HCL 4 MG/2ML IJ SOLN
4.0000 mg | Freq: Once | INTRAMUSCULAR | Status: AC
  Administered 2013-12-07: 4 mg via INTRAVENOUS
  Filled 2013-12-07: qty 2

## 2013-12-07 MED ORDER — FENTANYL CITRATE 0.05 MG/ML IJ SOLN
50.0000 ug | Freq: Once | INTRAMUSCULAR | Status: AC
  Administered 2013-12-07: 50 ug via NASAL
  Filled 2013-12-07: qty 2

## 2013-12-07 MED ORDER — MELOXICAM 15 MG PO TABS: 15.0000 mg | ORAL_TABLET | Freq: Every day | ORAL | Status: DC

## 2013-12-07 NOTE — ED Provider Notes (Signed)
Claire Turner 8:00 PM [place] Patient with lower abdominal pain especially in the right lower quadrant. CT scan pending to rule out appendicitis. Possible slight discomfort on pelvic exam if CT scan negative May also consider ultrasound to evaluate ovaries.  9:00 PM CT scan shows normal appendix. There is concerns for possible left ovarian mass. Will proceed with ultrasound to rule out ovarian cyst or torsion. Discussed plan with patient who agrees. She is still in some discomforts. Will also give additional pain medicine.  10:30 PM ultrasound was somewhat limited without good visualization of the right ovary. Left ovary with good blood flow. Small left ovarian cyst. No other concerning mass in the left adnexa. Patient is feeling better after additional pain medicine. I discussed the findings. At this time she is stable for discharge home to followup with PCP and an OB/GYN specialist. She agrees with this plan. Strict return precautions given.  Angus Sellereter S Audreyana Huntsberry, PA-C 12/07/13 2233

## 2013-12-07 NOTE — ED Notes (Signed)
Pt finished oral contrast, CT notified.

## 2013-12-07 NOTE — ED Notes (Signed)
Pt returned from US. Placed back on heart monitor.

## 2013-12-07 NOTE — ED Notes (Addendum)
[facility] began yesterday evening at 8 pm and has gotten worse. Pain is constant and made slightly better with position change.  Abdomen is non distended.

## 2013-12-07 NOTE — ED Provider Notes (Signed)
CSN: [id number]     Arrival date & time 12/07/13  1659 History   First MD Initiated Contact with Patient 12/07/13 1804     Chief Complaint  Patient presents with  . Abdominal Pain     HPI Pt was seen at 1810.  Per pt, c/o gradual onset and persistence of constant RLQ abd "pain" since last night. Describes the abd pain as "stabbing."  Denies N/V, no diarrhea, no fevers, no back pain, no rash, no CP/SOB, no black or blood in stools, no dysuria/hematuria, no vaginal bleeding/discharge.       History reviewed. No pertinent past medical history.  Past Surgical History  Procedure Laterality Date  . Cholecystectomy      History  Substance Use Topics  . Smoking status: Never Smoker   . Smokeless tobacco: Not on file  . Alcohol Use: No    Review of Systems ROS: Statement: All systems negative except as marked or noted in the HPI; Constitutional: Negative for fever and chills. ; ; Eyes: Negative for eye pain, redness and discharge. ; ; ENMT: Negative for ear pain, hoarseness, nasal congestion, sinus pressure and sore throat. ; ; Cardiovascular: Negative for chest pain, palpitations, diaphoresis, dyspnea and peripheral edema. ; ; Respiratory: Negative for cough, wheezing and stridor. ; ; Gastrointestinal: +abd pain. Negative for nausea, vomiting, diarrhea, blood in stool, hematemesis, jaundice and rectal bleeding. . ; ; Genitourinary: Negative for dysuria, flank pain and hematuria. ; ; GYN:  No vaginal bleeding, no vaginal discharge, no vulvar pain. ;;  Musculoskeletal: Negative for back pain and neck pain. Negative for swelling and trauma.; ; Skin: Negative for pruritus, rash, abrasions, blisters, bruising and skin lesion.; ; Neuro: Negative for headache, lightheadedness and neck stiffness. Negative for weakness, altered level of consciousness , altered mental status, extremity weakness, paresthesias, involuntary movement, seizure and syncope.      Allergies  Other  Home Medications    Prior to Admission medications   Medication Sig Start Date End Date Taking? Authorizing Provider  Multiple Vitamin (MULTIVITAMIN WITH MINERALS) TABS tablet Take 1 tablet by mouth daily.   Yes Historical Provider, MD  zolpidem (AMBIEN) 5 MG tablet Take 0.5 mg by mouth at bedtime as needed for sleep.   Yes Historical Provider, MD   BP 100/57  Pulse 67  Temp(Src) 98.5 F (36.9 C) (Oral)  Resp 13  SpO2 100%  LMP 11/17/2013 Physical Exam 1815: Physical examination:  Nursing notes reviewed; Vital signs and O2 SAT reviewed;  Constitutional: Well developed, Well nourished, Well hydrated, Uncomfortable appearing.; Head:  Normocephalic, atraumatic; Eyes: EOMI, PERRL, No scleral icterus; ENMT: Mouth and pharynx normal, Mucous membranes moist; Neck: Supple, Full range of motion, No lymphadenopathy; Cardiovascular: Regular rate and rhythm, No murmur, rub, or gallop; Respiratory: Breath sounds clear & equal bilaterally, No rales, rhonchi, wheezes.  Speaking full sentences with ease, Normal respiratory effort/excursion; Chest: Nontender, Movement normal; Abdomen: Soft, +RLQ tenderness to palp. No rebound. Nondistended, Normal bowel sounds; Genitourinary: No CVA tenderness; Pelvic exam performed with permission of pt and female ED tech assist during exam.  External genitalia w/o lesions. Vaginal vault with thick white discharge.  Cervix w/o lesions, not friable, GC/chlam and wet prep obtained and sent to lab.  Bimanual exam w/o CMT, uterine or adnexal tenderness.;; Extremities: Pulses normal, No tenderness, No edema, No calf edema or asymmetry.; Neuro: AA&Ox3, Major CN grossly intact.  Speech clear. No gross focal motor or sensory deficits in extremities.; Skin: Color normal, Warm, Dry.   ED  Course  Procedures     EKG Interpretation None      MDM  MDM Reviewed: previous chart, nursing note and vitals Reviewed previous: labs Interpretation: labs and CT scan   Results for orders placed during the  hospital encounter of 12/07/13  WET PREP, GENITAL      Result Value Ref Range   Yeast Wet Prep HPF POC NONE SEEN  NONE SEEN   Trich, Wet Prep NONE SEEN  NONE SEEN   Clue Cells Wet Prep HPF POC FEW (*) NONE SEEN   WBC, Wet Prep HPF POC FEW (*) NONE SEEN  CBC WITH DIFFERENTIAL      Result Value Ref Range   WBC 6.0  4.0 - 10.5 K/uL   RBC 4.50  3.87 - 5.11 MIL/uL   Hemoglobin 13.3  12.0 - 15.0 g/dL   HCT 47.8  29.5 - 62.1 %   MCV 89.3  78.0 - 100.0 fL   MCH 29.6  26.0 - 34.0 pg   MCHC 33.1  30.0 - 36.0 g/dL   RDW 30.8  65.7 - 84.6 %   Platelets 251  150 - 400 K/uL   Neutrophils Relative % 55  43 - 77 %   Neutro Abs 3.3  1.7 - 7.7 K/uL   Lymphocytes Relative 35  12 - 46 %   Lymphs Abs 2.1  0.7 - 4.0 K/uL   Monocytes Relative 10  3 - 12 %   Monocytes Absolute 0.6  0.1 - 1.0 K/uL   Eosinophils Relative 0  0 - 5 %   Eosinophils Absolute 0.0  0.0 - 0.7 K/uL   Basophils Relative 0  0 - 1 %   Basophils Absolute 0.0  0.0 - 0.1 K/uL  COMPREHENSIVE METABOLIC PANEL      Result Value Ref Range   Sodium 138  137 - 147 mEq/L   Potassium 3.9  3.7 - 5.3 mEq/L   Chloride 101  96 - 112 mEq/L   CO2 24  19 - 32 mEq/L   Glucose, Bld 89  70 - 99 mg/dL   BUN 14  6 - 23 mg/dL   Creatinine, Ser 9.62  0.50 - 1.10 mg/dL   Calcium 9.8  8.4 - 95.2 mg/dL   Total Protein 8.1  6.0 - 8.3 g/dL   Albumin 4.2  3.5 - 5.2 g/dL   AST 18  0 - 37 U/L   ALT 17  0 - 35 U/L   Alkaline Phosphatase 52  39 - 117 U/L   Total Bilirubin 0.4  0.3 - 1.2 mg/dL   GFR calc non Af Amer >90  >90 mL/min   GFR calc Af Amer >90  >90 mL/min  PREGNANCY, URINE      Result Value Ref Range   Preg Test, Ur NEGATIVE  NEGATIVE  URINALYSIS, ROUTINE W REFLEX MICROSCOPIC      Result Value Ref Range   Color, Urine YELLOW  YELLOW   APPearance CLEAR  CLEAR   Specific Gravity, Urine 1.022  1.005 - 1.030   pH 5.5  5.0 - 8.0   Glucose, UA NEGATIVE  NEGATIVE mg/dL   Hgb urine dipstick NEGATIVE  NEGATIVE   Bilirubin Urine NEGATIVE  NEGATIVE    Ketones, ur NEGATIVE  NEGATIVE mg/dL   Protein, ur NEGATIVE  NEGATIVE mg/dL   Urobilinogen, UA 0.2  0.0 - 1.0 mg/dL   Nitrite NEGATIVE  NEGATIVE   Leukocytes, UA NEGATIVE  NEGATIVE  LIPASE, BLOOD  Result Value Ref Range   Lipase 32  11 - 59 U/L    2025:  +BV on wet prep. CT A/P pending. Sign out to PA Dammen/EDP Dr. Lynelle DoctorKnapp.     Laray AngerKathleen M Darika Ildefonso, DO 12/07/13 2027

## 2013-12-07 NOTE — Discharge Instructions (Signed)
You were seen and evaluated for your abdominal pain. Your laboratory testing, CAT scan and ultrasound studies have not shown any emergent cause for your symptoms. You were found to have a left ovarian cyst which may be causing some of your pain. Please take the medication prescribed and followup with the primary care provider or OB/GYN for further evaluation and treatment.    Abdominal Pain, Women Abdominal (stomach, pelvic, or belly) pain can be caused by many things. It is important to tell your doctor:  The location of the pain.  Does it come and go or is it present all the time?  Are there things that start the pain (eating certain foods, exercise)?  Are there other symptoms associated with the pain (fever, nausea, vomiting, diarrhea)? All of this is helpful to know when trying to find the cause of the pain. CAUSES   Stomach: virus or bacteria infection, or ulcer.  Intestine: appendicitis (inflamed appendix), regional ileitis (Crohn's disease), ulcerative colitis (inflamed colon), irritable bowel syndrome, diverticulitis (inflamed diverticulum of the colon), or cancer of the stomach or intestine.  Gallbladder disease or stones in the gallbladder.  Kidney disease, kidney stones, or infection.  Pancreas infection or cancer.  Fibromyalgia (pain disorder).  Diseases of the female organs:  Uterus: fibroid (non-cancerous) tumors or infection.  Fallopian tubes: infection or tubal pregnancy.  Ovary: cysts or tumors.  Pelvic adhesions (scar tissue).  Endometriosis (uterus lining tissue growing in the pelvis and on the pelvic organs).  Pelvic congestion syndrome (female organs filling up with blood just before the menstrual period).  Pain with the menstrual period.  Pain with ovulation (producing an egg).  Pain with an IUD (intrauterine device, birth control) in the uterus.  Cancer of the female organs.  Functional pain (pain not caused by a disease, may improve without  treatment).  Psychological pain.  Depression. DIAGNOSIS  Your doctor will decide the seriousness of your pain by doing an examination.  Blood tests.  X-rays.  Ultrasound.  CT scan (computed tomography, special type of X-ray).  MRI (magnetic resonance imaging).  Cultures, for infection.  Barium enema (dye inserted in the large intestine, to better view it with X-rays).  Colonoscopy (looking in intestine with a lighted tube).  Laparoscopy (minor surgery, looking in abdomen with a lighted tube).  Major abdominal exploratory surgery (looking in abdomen with a large incision). TREATMENT  The treatment will depend on the cause of the pain.   Many cases can be observed and treated at home.  Over-the-counter medicines recommended by your caregiver.  Prescription medicine.  Antibiotics, for infection.  Birth control pills, for painful periods or for ovulation pain.  Hormone treatment, for endometriosis.  Nerve blocking injections.  Physical therapy.  Antidepressants.  Counseling with a psychologist or psychiatrist.  Minor or major surgery. HOME CARE INSTRUCTIONS   Do not take laxatives, unless directed by your caregiver.  Take over-the-counter pain medicine only if ordered by your caregiver. Do not take aspirin because it can cause an upset stomach or bleeding.  Try a clear liquid diet (broth or water) as ordered by your caregiver. Slowly move to a bland diet, as tolerated, if the pain is related to the stomach or intestine.  Have a thermometer and take your temperature several times a day, and record it.  Bed rest and sleep, if it helps the pain.  Avoid sexual intercourse, if it causes pain.  Avoid stressful situations.  Keep your follow-up appointments and tests, as your caregiver orders.  If  the pain does not go away with medicine or surgery, you may try:  Acupuncture.  Relaxation exercises (yoga, meditation).  Group therapy.  Counseling. SEEK  MEDICAL CARE IF:   You notice certain foods cause stomach pain.  Your home care treatment is not helping your pain.  You need stronger pain medicine.  You want your IUD removed.  You feel faint or lightheaded.  You develop nausea and vomiting.  You develop a rash.  You are having side effects or an allergy to your medicine. SEEK IMMEDIATE MEDICAL CARE IF:   Your pain does not go away or gets worse.  You have a fever.  Your pain is felt only in portions of the abdomen. The right side could possibly be appendicitis. The left lower portion of the abdomen could be colitis or diverticulitis.  You are passing blood in your stools (bright red or black tarry stools, with or without vomiting).  You have blood in your urine.  You develop chills, with or without a fever.  You pass out. MAKE SURE YOU:   Understand these instructions.  Will watch your condition.  Will get help right away if you are not doing well or get worse. Document Released: 05/04/2007 Document Revised: 09/29/2011 Document Reviewed: 05/24/2009 [facility] Patient Information 2014 [place] [place].    Ovarian Cyst An ovarian cyst is a fluid-filled sac that forms on an ovary. The ovaries are small organs that produce eggs in women. Various types of cysts can form on the ovaries. Most are not cancerous. Many do not cause problems, and they often go away on their own. Some may cause symptoms and require treatment. Common types of ovarian cysts include:  Functional cysts These cysts may occur every month during the menstrual cycle. This is normal. The cysts usually go away with the next menstrual cycle if the woman does not get pregnant. Usually, there are no symptoms with a functional cyst.  Endometrioma cysts These cysts form from the tissue that lines the uterus. They are also called "chocolate cysts" because they become filled with blood that turns brown. This type of cyst can cause pain in the lower abdomen during  intercourse and with your menstrual period.  Cystadenoma cysts This type develops from the cells on the outside of the ovary. These cysts can get very big and cause lower abdomen pain and pain with intercourse. This type of cyst can twist on itself, cut off its blood supply, and cause severe pain. It can also easily rupture and cause a lot of pain.  Dermoid cysts This type of cyst is sometimes found in both ovaries. These cysts may contain different kinds of body tissue, such as skin, teeth, hair, or cartilage. They usually do not cause symptoms unless they get very big.  Theca lutein cysts These cysts occur when too much of a certain hormone (human chorionic gonadotropin) is produced and overstimulates the ovaries to produce an egg. This is most common after procedures used to assist with the conception of a baby (in vitro fertilization). CAUSES   Fertility drugs can cause a condition in which multiple large cysts are formed on the ovaries. This is called ovarian hyperstimulation syndrome.  A condition called polycystic ovary syndrome can cause hormonal imbalances that can lead to nonfunctional ovarian cysts. SIGNS AND SYMPTOMS  Many ovarian cysts do not cause symptoms. If symptoms are present, they may include:  Pelvic pain or pressure.  Pain in the lower abdomen.  Pain during sexual intercourse.  Increasing girth (  swelling) of the abdomen.  Abnormal menstrual periods.  Increasing pain with menstrual periods.  Stopping having menstrual periods without being pregnant. DIAGNOSIS  These cysts are commonly found during a routine or annual pelvic exam. Tests may be ordered to find out more about the cyst. These tests may include:  Ultrasound.  X-ray of the pelvis.  CT scan.  MRI.  Blood tests. TREATMENT  Many ovarian cysts go away on their own without treatment. Your health care provider may want to check your cyst regularly for 2 3 months to see if it changes. For women in  menopause, it is particularly important to monitor a cyst closely because of the higher rate of ovarian cancer in menopausal women. When treatment is needed, it may include any of the following:  A procedure to drain the cyst (aspiration). This may be done using a long needle and ultrasound. It can also be done through a laparoscopic procedure. This involves using a thin, lighted tube with a tiny camera on the end (laparoscope) inserted through a small incision.  Surgery to remove the whole cyst. This may be done using laparoscopic surgery or an open surgery involving a larger incision in the lower abdomen.  Hormone treatment or birth control pills. These methods are sometimes used to help dissolve a cyst. HOME CARE INSTRUCTIONS   Only take over-the-counter or prescription medicines as directed by your health care provider.  Follow up with your health care provider as directed.  Get regular pelvic exams and Pap tests. SEEK MEDICAL CARE IF:   Your periods are late, irregular, or painful, or they stop.  Your pelvic pain or abdominal pain does not go away.  Your abdomen becomes larger or swollen.  You have pressure on your bladder or trouble emptying your bladder completely.  You have pain during sexual intercourse.  You have feelings of fullness, pressure, or discomfort in your stomach.  You lose weight for no apparent reason.  You feel generally ill.  You become constipated.  You lose your appetite.  You develop acne.  You have an increase in body and facial hair.  You are gaining weight, without changing your exercise and eating habits.  You think you are pregnant. SEEK IMMEDIATE MEDICAL CARE IF:   You have increasing abdominal pain.  You feel sick to your stomach (nauseous), and you throw up (vomit).  You develop a fever that comes on suddenly.  You have abdominal pain during a bowel movement.  Your menstrual periods become heavier than usual. Document Released:  07/07/2005 Document Revised: 04/27/2013 Document Reviewed: 03/14/2013 [facility] Patient Information 2014 [place], [place].

## 2013-12-07 NOTE — ED Notes (Signed)
Marcy Salvoaymond, US tech called and said that he was ready for pt, asked for female chaperone. This RN transported pt to US.

## 2013-12-08 LAB — GC/CHLAMYDIA PROBE AMP
CT PROBE, AMP APTIMA: NEGATIVE
GC PROBE AMP APTIMA: NEGATIVE

## 2014-01-18 ENCOUNTER — Ambulatory Visit (INDEPENDENT_AMBULATORY_CARE_PROVIDER_SITE_OTHER): Payer: BC Managed Care – PPO | Admitting: Obstetrics & Gynecology

## 2014-01-18 ENCOUNTER — Encounter: Payer: Self-pay | Admitting: Obstetrics & Gynecology

## 2014-01-18 VITALS — BP 115/75 | HR 70 | Temp 98.5°F | Wt 186.0 lb

## 2014-01-18 DIAGNOSIS — L293 Anogenital pruritus, unspecified: Secondary | ICD-10-CM

## 2014-01-18 DIAGNOSIS — N898 Other specified noninflammatory disorders of vagina: Secondary | ICD-10-CM

## 2014-01-19 LAB — WET PREP BY MOLECULAR PROBE
CANDIDA SPECIES: NEGATIVE
Gardnerella vaginalis: NEGATIVE
Trichomonas vaginosis: NEGATIVE

## 2014-03-25 NOTE — Progress Notes (Signed)
Patient ID: Claire Turner, female   DOB: Nov 19, 1980, 33 y.o.   MRN: [id number]  Chief Complaint  Patient presents with  . Ovarian Cyst    HPI Claire Turner is a 33 y.o. female.  A recent CT scan of the abdomen/pelvis was suggestive of an ovarian cyst.  A pelvic U/S was normal.  HPI  History reviewed. No pertinent past medical history.  Past Surgical History  Procedure Laterality Date  . Cholecystectomy      Family History  Problem Relation Age of Onset  . Hypertension Mother   . Stroke Maternal Grandmother     Social History History  Substance Use Topics  . Smoking status: Never Smoker   . Smokeless tobacco: Not on file  . Alcohol Use: Yes     Comment: occasional    Allergies  Allergen Reactions  . Other     Darvocet-vomiting     Current Outpatient Prescriptions  Medication Sig Dispense Refill  . Multiple Vitamin (MULTIVITAMIN WITH MINERALS) TABS tablet Take 1 tablet by mouth daily.      Marland Kitchen zolpidem (AMBIEN) 5 MG tablet Take 0.5 mg by mouth at bedtime as needed for sleep.      Marland Kitchen HYDROcodone-acetaminophen (NORCO/VICODIN) 5-325 MG per tablet Take 1 tablet by mouth every 4 (four) hours as needed for moderate pain.  6 tablet  0  . meloxicam (MOBIC) 15 MG tablet Take 1 tablet (15 mg total) by mouth daily.  30 tablet  0   No current facility-administered medications for this visit.    Review of Systems Review of Systems Constitutional: negative for fatigue and weight loss Respiratory: negative for cough and wheezing Cardiovascular: negative for chest pain, fatigue and palpitations Gastrointestinal: negative for abdominal pain and change in bowel habits Genitourinary: positive for abnormal vaginal discharge Integument/breast: negative for nipple discharge Musculoskeletal:negative for myalgias Neurological: negative for gait problems and tremors Behavioral/Psych: negative for abusive relationship, depression Endocrine: negative for temperature intolerance      Blood pressure 115/75, pulse 70, temperature 98.5 F (36.9 C), weight 84.369 kg (186 lb), last menstrual period 01/02/2014.  Physical Exam Physical Exam General:   alert  Skin:   no rash or abnormalities  Lungs:   clear to auscultation bilaterally  Heart:   regular rate and rhythm, S1, S2 normal, no murmur, click, rub or gallop  Abdomen:  normal findings: no organomegaly, soft, non-tender and no hernia  Pelvis:  External genitalia: normal general appearance Urinary system: urethral meatus normal and bladder without fullness, nontender Vaginal: thin, white vaginal discharge Cervix: normal appearance Adnexa: normal bimanual exam Uterus: anteverted and non-tender, normal size       Data Reviewed CT scan/Pelvic U/S  Assessment    Likely resolution of a functional ovarian cyst ?Vaginitis     Plan    Orders Placed This Encounter  Procedures  . WET PREP BY MOLECULAR PROBE    Follow up as needed.         JACKSON-MOORE,Jarika Robben A 03/25/2014, 1:46 PM

## 2014-07-17 ENCOUNTER — Encounter: Payer: Self-pay | Admitting: *Deleted

## 2014-07-18 ENCOUNTER — Encounter: Payer: Self-pay | Admitting: Obstetrics & Gynecology

## 2016-03-23 ENCOUNTER — Emergency Department (HOSPITAL_BASED_OUTPATIENT_CLINIC_OR_DEPARTMENT_OTHER)
Admission: EM | Admit: 2016-03-23 | Discharge: 2016-03-23 | Disposition: A | Discharge status: Home / Self Care | Payer: No Typology Code available for payment source | Attending: Emergency Medicine | Admitting: Emergency Medicine

## 2016-03-23 ENCOUNTER — Encounter (HOSPITAL_BASED_OUTPATIENT_CLINIC_OR_DEPARTMENT_OTHER): Payer: Self-pay | Admitting: *Deleted

## 2016-03-23 DIAGNOSIS — M25511 Pain in right shoulder: Secondary | ICD-10-CM | POA: Insufficient documentation

## 2016-03-23 DIAGNOSIS — M549 Dorsalgia, unspecified: Secondary | ICD-10-CM | POA: Insufficient documentation

## 2016-03-23 DIAGNOSIS — M25512 Pain in left shoulder: Secondary | ICD-10-CM | POA: Insufficient documentation

## 2016-03-23 DIAGNOSIS — Y9389 Activity, other specified: Secondary | ICD-10-CM | POA: Diagnosis not present

## 2016-03-23 DIAGNOSIS — S161XXA Strain of muscle, fascia and tendon at neck level, initial encounter: Secondary | ICD-10-CM | POA: Diagnosis not present

## 2016-03-23 DIAGNOSIS — Y92410 Unspecified street and highway as the place of occurrence of the external cause: Secondary | ICD-10-CM | POA: Diagnosis not present

## 2016-03-23 DIAGNOSIS — Y999 Unspecified external cause status: Secondary | ICD-10-CM | POA: Insufficient documentation

## 2016-03-23 DIAGNOSIS — S199XXA Unspecified injury of neck, initial encounter: Secondary | ICD-10-CM | POA: Diagnosis present

## 2016-03-23 MED ORDER — METHOCARBAMOL 500 MG PO TABS: 500.0000 mg | ORAL_TABLET | Freq: Two times a day (BID) | ORAL | 0 refills | Status: DC

## 2016-03-23 MED ORDER — IBUPROFEN 800 MG PO TABS: 800.0000 mg | ORAL_TABLET | Freq: Three times a day (TID) | ORAL | 0 refills | Status: DC
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
## 2017-11-02 ENCOUNTER — Emergency Department (HOSPITAL_BASED_OUTPATIENT_CLINIC_OR_DEPARTMENT_OTHER)
Admission: EM | Admit: 2017-11-02 | Discharge: 2017-11-02 | Disposition: A | Discharge status: Home / Self Care | Payer: Self-pay | Attending: Emergency Medicine | Admitting: Emergency Medicine

## 2017-11-02 ENCOUNTER — Other Ambulatory Visit: Payer: Self-pay

## 2017-11-02 ENCOUNTER — Emergency Department (HOSPITAL_BASED_OUTPATIENT_CLINIC_OR_DEPARTMENT_OTHER): Payer: Self-pay

## 2017-11-02 ENCOUNTER — Encounter (HOSPITAL_BASED_OUTPATIENT_CLINIC_OR_DEPARTMENT_OTHER): Payer: Self-pay | Admitting: *Deleted

## 2017-11-02 DIAGNOSIS — J069 Acute upper respiratory infection, unspecified: Secondary | ICD-10-CM | POA: Insufficient documentation

## 2017-11-02 DIAGNOSIS — B9789 Other viral agents as the cause of diseases classified elsewhere: Secondary | ICD-10-CM | POA: Insufficient documentation

## 2017-11-02 DIAGNOSIS — R05 Cough: Secondary | ICD-10-CM | POA: Insufficient documentation

## 2017-11-02 DIAGNOSIS — R509 Fever, unspecified: Secondary | ICD-10-CM | POA: Insufficient documentation

## 2017-11-02 DIAGNOSIS — R1110 Vomiting, unspecified: Secondary | ICD-10-CM | POA: Insufficient documentation

## 2017-11-02 DIAGNOSIS — J3489 Other specified disorders of nose and nasal sinuses: Secondary | ICD-10-CM | POA: Insufficient documentation

## 2017-11-02 MED ORDER — AMOXICILLIN 500 MG PO CAPS: 1000.0000 mg | ORAL_CAPSULE | Freq: Two times a day (BID) | ORAL | 0 refills | Status: DC

## 2017-11-02 NOTE — ED Provider Notes (Signed)
[facility] EMERGENCY DEPARTMENT Provider Note   CSN: [id number] Arrival date & time: 11/02/17  1125     History   Chief Complaint Chief Complaint  Patient presents with  . Chest Pain      HPI   Blood pressure 124/75, pulse 60, temperature 98.2 F (36.8 C), temperature source Oral, resp. rate 20, height 5' (1.524 m), weight 86.2 kg (190 lb), last menstrual period 10/26/2017, SpO2 99 %.  Claire Turner is a 37 y.o. female complaining of rhinorrhea, dry cough onset 1 week ago, initially associated with fever, she is also had several episodes of emesis intermittently she has had some cough with chest tightness seems to be pleuritic in nature onset this morning.  No active fevers, she is tolerating p.o.'s.  No abdominal pain.  She denies any history of DVT/PE, recent immobilizations, calf pain, leg swelling, exogenous estrogen, shortness of breath or palpitations.  History reviewed. No pertinent past medical history.  There are no active problems to display for this patient.   Past Surgical History:  Procedure Laterality Date  . CESAREAN SECTION    . CHOLECYSTECTOMY    . TUBAL LIGATION       OB History   None      Home Medications    Prior to Admission medications   Medication Sig Start Date End Date Taking? Authorizing Provider  amoxicillin (AMOXIL) 500 MG capsule Take 2 capsules (1,000 mg total) by mouth 2 (two) times daily. 11/02/17   Dub Maclellan, Joni Reining, PA-C  zolpidem (AMBIEN) 5 MG tablet Take 0.5 mg by mouth at bedtime as needed for sleep.    [provider]    Family History Family History  Problem Relation Age of Onset  . Hypertension Mother   . Stroke Maternal Grandmother     Social History Social History   Tobacco Use  . Smoking status: Never Smoker  . Smokeless tobacco: Never Used  Substance Use Topics  . Alcohol use: Yes    Comment: occasional  . Drug use: No     Allergies   Other   Review of Systems Review of  Systems  A complete review of systems was obtained and all systems are negative except as noted in the HPI and PMH.   Physical Exam Updated Vital Signs BP 110/65 (BP Location: Left Arm)   Pulse (!) 58   Temp 98.2 F (36.8 C) (Oral)   Resp 20   Ht 5' (1.524 m)   Wt 86.2 kg (190 lb)   LMP 10/26/2017   SpO2 100%   BMI 37.11 kg/m   Physical Exam  Constitutional: She is oriented to person, place, and time. She appears well-developed and well-nourished. No distress.  HENT:  Head: Normocephalic and atraumatic.  Right Ear: External ear normal.  Left Ear: External ear normal.  Mouth/Throat: Oropharynx is clear and moist. No oropharyngeal exudate.  No drooling or stridor. Posterior pharynx mildly erythematous no significant tonsillar hypertrophy. No exudate. Soft palate rises symmetrically. No TTP or induration under tongue.   No tenderness to palpation of frontal or bilateral maxillary sinuses.  Mild mucosal edema in the nares with scant rhinorrhea.  Bilateral tympanic membranes with normal architecture and good light reflex.    Eyes: Pupils are equal, round, and reactive to light. Conjunctivae and EOM are normal.  Neck: Normal range of motion. Neck supple. No JVD present. No tracheal deviation present.  Cardiovascular: Normal rate, regular rhythm and intact distal pulses.  Radial pulse equal bilaterally  Pulmonary/Chest:  Effort normal and breath sounds normal. No stridor. No respiratory distress. She has no wheezes. She has no rales. She exhibits no tenderness.  Abdominal: Soft. She exhibits no distension and no mass. There is no tenderness. There is no rebound and no guarding.  Musculoskeletal: Normal range of motion. She exhibits no edema or tenderness.  No calf asymmetry, superficial collaterals, palpable cords, edema, Homans sign negative bilaterally.    Neurological: She is alert and oriented to person, place, and time.  Skin: Skin is warm. She is not diaphoretic.    Psychiatric: She has a normal mood and affect.  Nursing note and vitals reviewed.    ED Treatments / Results  Labs (all labs ordered are listed, but only abnormal results are displayed) Labs Reviewed - No data to display  EKG None  Radiology Dg Chest 2 View  Result Date: 11/02/2017 CLINICAL DATA:  Upper respiratory symptoms for 1 week. Fever. Chest pain. EXAM: CHEST - 2 VIEW COMPARISON:  Thoracic spine radiographs 06/21/2008 FINDINGS: The cardiomediastinal silhouette is within normal limits. The lungs are well inflated and clear. There is no evidence of pleural effusion or pneumothorax. No acute osseous abnormality is identified. Right upper quadrant abdominal surgical clips are noted. IMPRESSION: No active cardiopulmonary disease. Electronically Signed   By: Sebastian AcheAllen  Grady M.D.   On: 11/02/2017 11:46    Procedures Procedures (including critical care time)  Medications Ordered in ED Medications - No data to display   Initial Impression / Assessment and Plan / ED Course  I have reviewed the triage vital signs and the nursing notes.  Pertinent labs & imaging results that were available during my care of the patient were reviewed by me and considered in my medical decision making (see chart for details).     Vitals:   11/02/17 1130 11/02/17 1132 11/02/17 1349  BP:  124/75 110/65  Pulse:  60 (!) 58  Resp:  20 20  Temp:  98.2 F (36.8 C)   TempSrc:  Oral   SpO2:  99% 100%  Weight: 86.2 kg (190 lb)    Height: 5' (1.524 m)     Claire Turner is 37 y.o. female presenting with some pleuritic chest pain over the course of a week, lung sounds clear, no tachypnea or tachycardia to suggest PE.  Physical exam is not consistent with a DVT.  I think this is likely viral upper respiratory infection however given the length of time and fever I will write her a prescription for amoxicillin, of advised her not to take this for 5 days but if she develops another fever feels significantly  worsening symptoms she can initiate antibiotics at that time.  Evaluation does not show pathology that would require ongoing emergent intervention or inpatient treatment. Pt is hemodynamically stable and mentating appropriately. Discussed findings and plan with patient/guardian, who agrees with care plan. All questions answered. Return precautions discussed and outpatient follow up given.      Final Clinical Impressions(s) / ED Diagnoses   Final diagnoses:  Viral URI with cough    ED Discharge Orders        Ordered    amoxicillin (AMOXIL) 500 MG capsule  2 times daily     11/02/17 1341       Taelyn Broecker, Mardella Laymanicole, PA-C 11/02/17 1653    Rolland PorterJames, Mark, MD 11/03/17 1445

## 2017-11-02 NOTE — ED Notes (Signed)
ED Provider at bedside. 

## 2017-11-02 NOTE — Discharge Instructions (Addendum)
You can use Flonase nasal spray which is avilable over the counter. Use nasal saline (you can try [employer] Simply Saline) at least 4 times a day, use saline 5-10 minutes before using the fluticasone (flonase) nasal spray ° °Do not use Afrin (Oxymetazoline) ° °Rest, wash hands frequently  and drink plenty of water. ° °You may try over the counter medication such as allegra-decongestant or claritin-decongestant and/or Mucinex or decongestants. ° °Push fluids to try to thin the mucus and get it to flow out the nose.  ° °Do not hesitate to return to the emergency room for any new, worsening or concerning symptoms. ° °Please obtain primary care using resource guide below. Let them know that you were seen in the emergency room and that they will need to obtain records for further outpatient management. ° ° ° °

## 2017-11-02 NOTE — ED Triage Notes (Signed)
PT c/o recent URI aytmpoms x 1 week , today c/o chest pain with pro cough

## 2017-12-25 ENCOUNTER — Encounter (HOSPITAL_BASED_OUTPATIENT_CLINIC_OR_DEPARTMENT_OTHER): Payer: Self-pay

## 2017-12-25 ENCOUNTER — Other Ambulatory Visit: Payer: Self-pay

## 2017-12-25 ENCOUNTER — Emergency Department (HOSPITAL_BASED_OUTPATIENT_CLINIC_OR_DEPARTMENT_OTHER)
Admission: EM | Admit: 2017-12-25 | Discharge: 2017-12-25 | Disposition: A | Discharge status: Home / Self Care | Payer: Self-pay | Attending: Emergency Medicine | Admitting: Emergency Medicine

## 2017-12-25 DIAGNOSIS — Z79899 Other long term (current) drug therapy: Secondary | ICD-10-CM | POA: Insufficient documentation

## 2017-12-25 DIAGNOSIS — H1033 Unspecified acute conjunctivitis, bilateral: Secondary | ICD-10-CM | POA: Insufficient documentation

## 2017-12-25 MED ORDER — FLUORESCEIN SODIUM 1 MG OP STRP
1.0000 | ORAL_STRIP | Freq: Once | OPHTHALMIC | Status: AC
  Administered 2017-12-25: 1 via OPHTHALMIC

## 2017-12-25 MED ORDER — TETRACAINE HCL 0.5 % OP SOLN
OPHTHALMIC | Status: AC
  Filled 2017-12-25: qty 4

## 2017-12-25 MED ORDER — POLYMYXIN B-TRIMETHOPRIM 10000-0.1 UNIT/ML-% OP SOLN: 2.0000 [drp] | OPHTHALMIC | 0 refills | Status: DC

## 2017-12-25 MED ORDER — FLUORESCEIN SODIUM 1 MG OP STRP
ORAL_STRIP | OPHTHALMIC | Status: AC
  Filled 2017-12-25: qty 1

## 2017-12-25 MED ORDER — TETRACAINE HCL 0.5 % OP SOLN
2.0000 [drp] | Freq: Once | OPHTHALMIC | Status: AC
  Administered 2017-12-25: 2 [drp] via OPHTHALMIC

## 2017-12-25 MED FILL — POLYMYXIN B/TMP EYE DROPS: 10000-0.1 | 10 days supply | Qty: 10 | Fill #0

## 2017-12-25 NOTE — ED Triage Notes (Addendum)
C/o bilat eye redness x 6 days-right eye worse x 3-4 days-denies injury-states son dx with pink eye last week-NAD-steady gait

## 2017-12-25 NOTE — ED Provider Notes (Signed)
[facility] EMERGENCY DEPARTMENT Provider Note   CSN: [id number] Arrival date & time: 12/25/17  1308     History   Chief Complaint Chief Complaint  Patient presents with  . Eye Problem    HPI Claire Turner is a 37 y.o. female.  The history is provided by the patient and medical records. No language interpreter was used.  Eye Problem   Associated symptoms include discharge and eye redness. Pertinent negatives include no photophobia.   Claire Turner is an otherwise healthy 37 y.o. female who presents to the Emergency Department complaining of bilateral eye pain and redness.  Patient states that she initially noticed redness, pain and itching to her left eye about 5-6 days ago.  3 days ago, she noticed redness, itching and pain to the right eye as well.  He has awoke in the last 2 days with eyes matted shut.  She has had some intermittent discharge throughout the day as well.  She has a son who was diagnosed with pinkeye last week.  She gave him prescription drops written by pediatrician and has cleared up.  He has done with treatment and improved.  She denies any fever, chills, cough, congestion.  Denies any change in vision.    History reviewed. No pertinent past medical history.  There are no active problems to display for this patient.   Past Surgical History:  Procedure Laterality Date  . CESAREAN SECTION    . CHOLECYSTECTOMY    . TUBAL LIGATION       OB History   None      Home Medications    Prior to Admission medications   Medication Sig Start Date End Date Taking? Authorizing Provider  amoxicillin (AMOXIL) 500 MG capsule Take 2 capsules (1,000 mg total) by mouth 2 (two) times daily. 11/02/17   Pisciotta, Joni ReiningNicole, PA-C  trimethoprim-polymyxin b (POLYTRIM) ophthalmic solution Place 2 drops into both eyes every 4 (four) hours. X 7-10 days. 12/25/17   Ward, Chase PicketJaime Pilcher, PA-C  zolpidem (AMBIEN) 5 MG tablet Take 0.5 mg by mouth at bedtime as needed for  sleep.    [provider]    Family History Family History  Problem Relation Age of Onset  . Hypertension Mother   . Stroke Maternal Grandmother     Social History Social History   Tobacco Use  . Smoking status: Never Smoker  . Smokeless tobacco: Never Used  Substance Use Topics  . Alcohol use: Yes    Comment: occasional  . Drug use: No     Allergies   Other   Review of Systems Review of Systems  Constitutional: Negative for chills and fever.  HENT: Negative for congestion, sinus pressure and sore throat.   Eyes: Positive for pain, discharge, redness and itching. Negative for photophobia and visual disturbance.  Respiratory: Negative for cough.      Physical Exam Updated Vital Signs BP (!) 116/55 (BP Location: Left Arm)   Pulse 75   Temp 98.4 F (36.9 C) (Oral)   Ht 5' (1.524 m)   Wt 91.2 kg (201 lb)   LMP 12/19/2017   SpO2 100%   BMI 39.26 kg/m   Physical Exam  Constitutional: She appears well-developed and well-nourished. No distress.  HENT:  Head: Normocephalic and atraumatic.  Eyes: Pupils are equal, round, and reactive to light. EOM and lids are normal. Lids are everted and swept, no foreign bodies found. Right conjunctiva is injected. Left conjunctiva is injected.  Slit lamp exam:  The right eye shows no corneal abrasion and no fluorescein uptake.       The left eye shows no corneal abrasion and no fluorescein uptake.  Neck: Neck supple.  Cardiovascular: Normal rate, regular rhythm and normal heart sounds.  No murmur heard. Pulmonary/Chest: Effort normal and breath sounds normal. No respiratory distress. She has no wheezes. She has no rales.  Neurological: She is alert.  Skin: Skin is warm and dry.  Nursing note and vitals reviewed.    ED Treatments / Results  Labs (all labs ordered are listed, but only abnormal results are displayed) Labs Reviewed - No data to display  EKG None  Radiology No results  found.  Procedures Procedures (including critical care time)  Medications Ordered in ED Medications  tetracaine (PONTOCAINE) 0.5 % ophthalmic solution 2 drop (2 drops Both Eyes Given by Other 12/25/17 1339)  fluorescein ophthalmic strip 1 strip (1 strip Both Eyes Given by Other 12/25/17 1339)     Initial Impression / Assessment and Plan / ED Course  I have reviewed the triage vital signs and the nursing notes.  Pertinent labs & imaging results that were available during my care of the patient were reviewed by me and considered in my medical decision making (see chart for details).    Claire Turner is a 37 y.o. female who presents to ED for bilateral eye redness and discharge. No visual changes.   Exam is consistent with conjunctivitis. Presentation not concerning for iritis or corneal abrasions. No consensual photophobia or signs of entrapment.  Tetracaine, fluoroscein and wood's lamp with no evidence of injury / defect to the either eye. Normal pupillary exam and normal EOM.  No consensual pain.   Patient discharged home with rx for polytrim.  Personal hygiene and frequent handwashing discussed. Patient advised to follow up with ophthalmologist if symptoms persist or worsen. Return precautions discussed. Patient verbalizes understanding and agreement with plan. All questions answered.   Final Clinical Impressions(s) / ED Diagnoses   Final diagnoses:  Acute bacterial conjunctivitis of both eyes    ED Discharge Orders        Ordered    trimethoprim-polymyxin b (POLYTRIM) ophthalmic solution  Every 4 hours,   Status:  Discontinued     12/25/17 1349    trimethoprim-polymyxin b (POLYTRIM) ophthalmic solution  Every 4 hours     12/25/17 1354       Ward, Chase Picket, PA-C 12/25/17 1408    Tilden Fossa, MD 12/26/17 [phone]

## 2017-12-25 NOTE — ED Notes (Signed)
ED Provider at bedside. 

## 2017-12-25 NOTE — Discharge Instructions (Signed)
It was my pleasure taking care of you today!   Apply drops as directed.   Pink eye is very contagious and spreads by direct contact. Try to avoid rubbing eyes and wash hands often.  You may be given antibiotic eyedrops as part of your treatment. Before using your eye medicine, remove all drainage from the eye by washing gently with warm water and cotton balls. Continue to use the medication until you have awakened 2 mornings in a row without discharge from the eye. Do not rub your eye. This increases the irritation and helps spread infection. Use separate towels from other household members. Wash your hands with soap and water before and after touching your eyes. Use cold compresses to reduce pain and sunglasses to relieve irritation from light. Do not wear contact lenses or wear eye makeup until the infection is gone.   SEEK MEDICAL CARE IF:  Your symptoms are not better after 3 days of treatment.  You have increased pain or trouble seeing.  The outer eyelids become very red or swollen.  You develop double vision or your vision becomes blurred or worsens in any way.  You have trouble moving your eyes.  You develop a severe headache, severe neck pain, or neck stiffness.  You develop repeated vomiting.  You have a fever or persistent symptoms for more than 72 hours.  You have a fever and your symptoms suddenly get worse.

## 2020-04-04 ENCOUNTER — Other Ambulatory Visit: Payer: Self-pay

## 2020-04-04 ENCOUNTER — Emergency Department (HOSPITAL_BASED_OUTPATIENT_CLINIC_OR_DEPARTMENT_OTHER): Payer: HRSA Program

## 2020-04-04 ENCOUNTER — Emergency Department (HOSPITAL_BASED_OUTPATIENT_CLINIC_OR_DEPARTMENT_OTHER)
Admission: EM | Admit: 2020-04-04 | Discharge: 2020-04-04 | Disposition: A | Discharge status: Home / Self Care | Payer: HRSA Program | Attending: Emergency Medicine | Admitting: Emergency Medicine

## 2020-04-04 ENCOUNTER — Encounter (HOSPITAL_BASED_OUTPATIENT_CLINIC_OR_DEPARTMENT_OTHER): Payer: Self-pay | Admitting: Emergency Medicine

## 2020-04-04 ENCOUNTER — Telehealth: Payer: Self-pay | Admitting: Unknown Physician Specialty

## 2020-04-04 DIAGNOSIS — U071 COVID-19: Secondary | ICD-10-CM | POA: Insufficient documentation

## 2020-04-04 DIAGNOSIS — R05 Cough: Secondary | ICD-10-CM | POA: Diagnosis present

## 2020-04-04 NOTE — Telephone Encounter (Signed)
Called to Discuss with patient about Covid symptoms and the use of the monoclonal antibody infusion for those with mild to moderate Covid symptoms and at a high risk of hospitalization.     Pt appears to qualify for this infusion due to co-morbid conditions and/or a member of an at-risk group in accordance with the FDA Emergency Use Authorization.    Unable to reach pt    

## 2020-04-04 NOTE — ED Provider Notes (Signed)
[facility] EMERGENCY DEPARTMENT Provider Note   CSN: [id number] Arrival date & time: 04/04/20  1209     History Chief Complaint  Patient presents with  . Cough    Claire Turner is a 39 y.o. female with no significant past medical history presents the ED with complaints of cough productive of brownish sputum x7 days.  Patient reports that her 88 year old son tested positive for COVID-19.  Patient has been endorsing generalized body aches, intermittent fevers and chills as high as 101 F, headache, and mildly diminished appetite.  She received her test results earlier this morning which were positive for COVID-19.  She is here inquiring about possible antibiotic given her cough symptoms.  She has not taken any Tylenol, ibuprofen, or antitussives for her symptoms.  She has simply been trying her best to eat regular meals and drink plenty of fluids.  She has a Gatorade at bedside and states that she has been replenishing electrolytes.  She does not want any laboratory work-up done here today.  Patient notes that she has a pulse oximeter at home and she has not dipped below 97% on room air.  She denies any chest pain, difficulty breathing, hemoptysis, history of clots or clotting disorder, unilateral extremity swelling or edema, abdominal pain, changes in bowel habits, or urinary symptoms.   HPI     History reviewed. No pertinent past medical history.  There are no problems to display for this patient.   Past Surgical History:  Procedure Laterality Date  . CESAREAN SECTION    . CHOLECYSTECTOMY    . TUBAL LIGATION       OB History   No obstetric history on file.     Family History  Problem Relation Age of Onset  . Hypertension Mother   . Stroke Maternal Grandmother     Social History   Tobacco Use  . Smoking status: Never Smoker  . Smokeless tobacco: Never Used  Substance Use Topics  . Alcohol use: Yes    Comment: occasional  . Drug use: No    Home  Medications Prior to Admission medications   Medication Sig Start Date End Date Taking? Authorizing Provider  Ascorbic Acid (VITAMIN C PO) Take 1 capsule by mouth daily.   Yes [provider]  Multiple Vitamins-Minerals (ZINC PO) Take 1 tablet by mouth daily.   Yes [provider]  VITAMIN D PO Take 1 capsule by mouth daily.   Yes [provider]    Allergies    Other  Review of Systems   Review of Systems  All other systems reviewed and are negative.   Physical Exam Updated Vital Signs BP 119/70 (BP Location: Right Arm)   Pulse 69   Temp 98.1 F (36.7 C) (Oral)   Resp 18   LMP 03/20/2020   SpO2 99%   Physical Exam Vitals and nursing note reviewed. Exam conducted with a chaperone present.  Constitutional:      General: She is not in acute distress. HENT:     Head: Normocephalic and atraumatic.  Eyes:     General: No scleral icterus.    Conjunctiva/sclera: Conjunctivae normal.  Cardiovascular:     Rate and Rhythm: Normal rate and regular rhythm.     Pulses: Normal pulses.     Heart sounds: Normal heart sounds.  Pulmonary:     Comments: No increased work of breathing.  Laughing on exam.  Speaks in full sentences.  No abnormal breath sounds auscultated.  Symmetric  chest rise.  No accessory muscle use or respiratory distress.  99% on room air. Abdominal:     General: Abdomen is flat. There is no distension.     Palpations: Abdomen is soft.     Tenderness: There is no abdominal tenderness.  Musculoskeletal:     Right lower leg: No edema.     Left lower leg: No edema.  Skin:    General: Skin is dry.     Capillary Refill: Capillary refill takes less than 2 seconds.  Neurological:     Mental Status: She is alert.     GCS: GCS eye subscore is 4. GCS verbal subscore is 5. GCS motor subscore is 6.  Psychiatric:        Mood and Affect: Mood normal.        Behavior: Behavior normal.        Thought Content: Thought content normal.     ED  Results / Procedures / Treatments   Labs (all labs ordered are listed, but only abnormal results are displayed) Labs Reviewed - No data to display  EKG None  Radiology DG Chest Portable 1 View  Result Date: 04/04/2020 CLINICAL DATA:  Cough and congestion 3 days EXAM: PORTABLE CHEST 1 VIEW COMPARISON:  11/02/2017 FINDINGS: The heart size and mediastinal contours are within normal limits. Both lungs are clear. The visualized skeletal structures are unremarkable. IMPRESSION: No active disease. Electronically Signed   By: Marlan Palau M.D.   On: 04/04/2020 13:47    Procedures Procedures (including critical care time)  Medications Ordered in ED Medications - No data to display  ED Course  I have reviewed the triage vital signs and the nursing notes.  Pertinent labs & imaging results that were available during my care of the patient were reviewed by me and considered in my medical decision making (see chart for details).    MDM Rules/Calculators/A&P                          Patient is presenting to the ED with concerns for bacterial pneumonia in the setting of COVID-19 infection.  I personally reviewed plain films obtained of her chest which demonstrate no areas of consolidation concerning for pneumonia.  Her symptoms are all suggestive of COVID-19 for which she tested positive.  She denies any significant symptoms of weakness/fatigue and states that she has been eating and drinking regularly despite her diminished appetite.  She has been Gatorade at bedside and states that she has been replenishing her electrolytes.  Engaged in mutual decision-making with patient and do not feel as though laboratory work-up is warranted despite 7-day chronicity of illness.    We will send a message to [facility] so that they may reach out to patient regarding possible infusion treatments given her cough symptoms.  She has not yet taken anything over-the-counter and I am recommending Mucinex as  well as Tylenol and ibuprofen as needed for fever control/body aches.  Strict ED return precautions discussed with patient.  Patient voices understanding and is agreeable to the plan.  I also spoke with her husband.  Claire Turner was evaluated in Emergency Department on 04/04/2020 for the symptoms described in the history of present illness. She was evaluated in the context of the global COVID-19 pandemic, which necessitated consideration that the patient might be at risk for infection with the SARS-CoV-2 virus that causes COVID-19. Institutional protocols and algorithms that pertain to the evaluation of  patients at risk for COVID-19 are in a state of rapid change based on information released by regulatory bodies including the CDC and federal and state organizations. These policies and algorithms were followed during the patient's care in the ED.   Final Clinical Impression(s) / ED Diagnoses Final diagnoses:  COVID-19    Rx / DC Orders ED Discharge Orders    None       Lorelee New, PA-C 04/04/20 1617    Cathren Laine, MD 04/05/20 1704

## 2020-04-04 NOTE — ED Notes (Signed)
Had covid test yesterday  Got POSITIVE results this am

## 2020-04-04 NOTE — Discharge Instructions (Addendum)
Please continue to check your temperature regularly at home and take Tylenol or ibuprofen as needed for fever control.  He may also take the ibuprofen/naproxen for body aches.  These medications can be obtained over-the-counter.  I would also like for you to take over-the-counter Mucinex as needed for relief of your cough.  I have sent a message to the [facility].  This is a treatment for COVID-19.  They should give you a phone call in the next 24 to 48 hours to see if you qualify for infusion.  Continue to check your oxygenation with pulse oximetry should you be feeling short of breath.  Return to the ED or seek immediate medical attention should you experience any new or worsening symptoms.

## 2020-04-04 NOTE — ED Triage Notes (Signed)
PT here with cough and chest congestion x 3 days.
